# Patient Record
Sex: Male | Born: 2000 | Race: White | Hispanic: No | Marital: Single
Health system: Southern US, Community
[De-identification: ages and names within clinical notes are randomized; demographics above are authoritative.]

---
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
## 2022-04-29 ENCOUNTER — Ambulatory Visit: Payer: Managed Care, Other (non HMO) | Admitting: Podiatry

## 2022-04-29 DIAGNOSIS — L600 Ingrowing nail: Secondary | ICD-10-CM | POA: Diagnosis not present

## 2022-04-29 MED ORDER — GENTAMICIN SULFATE 0.1 % EX CREA: 1.0000 | TOPICAL_CREAM | Freq: Two times a day (BID) | CUTANEOUS | 1 refills | Status: AC

## 2022-04-29 MED ORDER — DOXYCYCLINE HYCLATE 100 MG PO TABS: 100.0000 mg | ORAL_TABLET | Freq: Two times a day (BID) | ORAL | 0 refills | Status: AC

## 2022-04-29 NOTE — Progress Notes (Signed)
   Chief Complaint  Patient presents with   Ingrown Toenail    Bilateral ingrown toe nail, patient wants them removed.    Subjective: Patient presents today for evaluation of pain to the lateral border bilateral great toes. Patient is concerned for possible ingrown nail.  It is very sensitive to touch.  Patient presents today for further treatment and evaluation.  No past medical history on file.  Objective:  General: Well developed, nourished, in no acute distress, alert and oriented x3   Dermatology: Skin is warm, dry and supple bilateral.  Lateral border bilateral great toes is tender with evidence of an ingrowing nail. Pain on palpation noted to the border of the nail fold. The remaining nails appear unremarkable at this time. There are no open sores, lesions.  Vascular: DP and PT pulses palpable.  No clinical evidence of vascular compromise  Neruologic: Grossly intact via light touch bilateral.  Musculoskeletal: No pedal deformity noted  Assesement: #1 Paronychia with ingrowing nail lateral border bilateral great toes  Plan of Care:  1. Patient evaluated.  2. Discussed treatment alternatives and plan of care. Explained nail avulsion procedure and post procedure course to patient. 3. Patient opted for permanent partial nail avulsion of the ingrown portion of the nail.  4. Prior to procedure, local anesthesia infiltration utilized using 3 ml of a 50:50 mixture of 2% plain lidocaine and 0.5% plain marcaine in a normal hallux block fashion and a betadine prep performed.  5. Partial permanent nail avulsion with chemical matrixectomy performed using 3x30sec applications of phenol followed by alcohol flush.  6. Light dressing applied.  Post care instructions provided 7.  Prescription for gentamicin 2% cream  8.  Prescription for doxycycline 100 mg 2 times daily #20  9.  Return to clinic 2 weeks.  *Works at [employer]. Going Tubing on the [place] this weekend  Felecia Shelling,  DPM [facility]  Dr. Felecia Shelling, DPM    [address] [place], [place] [address]                Office [phone]  Fax [phone]

## 2022-05-20 ENCOUNTER — Ambulatory Visit (INDEPENDENT_AMBULATORY_CARE_PROVIDER_SITE_OTHER): Payer: Managed Care, Other (non HMO) | Admitting: Podiatry

## 2022-05-20 DIAGNOSIS — L600 Ingrowing nail: Secondary | ICD-10-CM

## 2022-05-20 NOTE — Progress Notes (Signed)
   Chief Complaint  Patient presents with   Follow-up    Patient is here for nail check bilateral Great toe ingrown toe nail.    Subjective: 21 y.o. male presents today status post permanent nail avulsion procedure of the lateral border bilateral great toes that was performed on 04/29/2022.  Patient states that he is feeling well.  He no longer has any pain or tenderness.  No new complaints at this time  No past medical history on file.  Objective: Neurovascular status intact.  Skin is warm, dry and supple. Nail and respective nail fold appears to be healing appropriately.   Assessment: #1 s/p partial permanent nail matrixectomy lateral border bilateral great toes 04/29/2022   Plan of care: #1 patient was evaluated  #2 light debridement of the periungual debris was performed to the border of the respective toe and nail plate using a tissue nipper. #3 patient is to return to clinic on a PRN basis.   Felecia Shelling, DPM [facility]  Dr. Felecia Shelling, DPM    [address] [place], [place] [address]                Office [phone]  Fax [phone]

## 2024-05-13 ENCOUNTER — Other Ambulatory Visit: Payer: Self-pay | Admitting: Internal Medicine

## 2024-05-13 DIAGNOSIS — R634 Abnormal weight loss: Secondary | ICD-10-CM

## 2024-05-13 DIAGNOSIS — R1084 Generalized abdominal pain: Secondary | ICD-10-CM

## 2024-05-13 DIAGNOSIS — K529 Noninfective gastroenteritis and colitis, unspecified: Secondary | ICD-10-CM

## 2024-05-19 ENCOUNTER — Encounter: Payer: Self-pay | Admitting: Internal Medicine

## 2024-05-24 ENCOUNTER — Ambulatory Visit
Admission: RE | Admit: 2024-05-24 | Discharge: 2024-05-24 | Disposition: A | Discharge status: Home / Self Care | Source: Ambulatory Visit | Attending: Internal Medicine | Admitting: Internal Medicine

## 2024-05-24 ENCOUNTER — Other Ambulatory Visit

## 2024-05-24 DIAGNOSIS — K529 Noninfective gastroenteritis and colitis, unspecified: Secondary | ICD-10-CM

## 2024-05-24 DIAGNOSIS — R634 Abnormal weight loss: Secondary | ICD-10-CM

## 2024-05-24 DIAGNOSIS — R1084 Generalized abdominal pain: Secondary | ICD-10-CM
